# Patient Record
Sex: Male | Born: 1995 | Race: Black or African American | Hispanic: No | Marital: Single | Smoking: Never smoker
Health system: Southern US, Community
[De-identification: ages and names within clinical notes are randomized; demographics above are authoritative.]

---
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
## 2017-04-09 ENCOUNTER — Emergency Department
Admission: EM | Admit: 2017-04-09 | Discharge: 2017-04-09 | Disposition: A | Discharge status: Home / Self Care | Payer: Medicaid Other | Attending: Emergency Medicine | Admitting: Emergency Medicine

## 2017-04-09 ENCOUNTER — Encounter: Payer: Self-pay | Admitting: Emergency Medicine

## 2017-04-09 DIAGNOSIS — K600 Acute anal fissure: Secondary | ICD-10-CM | POA: Diagnosis not present

## 2017-04-09 DIAGNOSIS — K6289 Other specified diseases of anus and rectum: Secondary | ICD-10-CM | POA: Diagnosis present

## 2017-04-09 DIAGNOSIS — K602 Anal fissure, unspecified: Secondary | ICD-10-CM

## 2017-04-09 MED ORDER — NITROGLYCERIN 0.4 % RE OINT: 1.0000 "application " | TOPICAL_OINTMENT | Freq: Two times a day (BID) | RECTAL | 0 refills | Status: DC

## 2017-04-09 MED ORDER — LIDOCAINE VISCOUS 2 % MT SOLN: 20.0000 mL | OROMUCOSAL | 0 refills | Status: DC | PRN

## 2017-04-09 MED ORDER — LIDOCAINE 2 % EX GEL: 1.0000 "application " | Freq: Two times a day (BID) | CUTANEOUS | 0 refills | Status: DC

## 2017-04-09 MED ORDER — LIDOCAINE 2 % EX GEL: 1.0000 "application " | Freq: Two times a day (BID) | CUTANEOUS | 0 refills | Status: AC
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
## 2017-04-09 NOTE — Discharge Instructions (Signed)
Rectal nitroglycerin can make you dizzy, please apply while sitting and stay sitting or lying down for 30 minutes after application. Increase the fiber in your diet to have soft stools
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
## 2019-05-12 ENCOUNTER — Encounter: Payer: Self-pay | Admitting: Emergency Medicine

## 2019-05-12 ENCOUNTER — Emergency Department
Admission: EM | Admit: 2019-05-12 | Discharge: 2019-05-12 | Disposition: A | Discharge status: Home / Self Care | Payer: Self-pay | Attending: Emergency Medicine | Admitting: Emergency Medicine

## 2019-05-12 ENCOUNTER — Emergency Department: Payer: Self-pay

## 2019-05-12 ENCOUNTER — Other Ambulatory Visit: Payer: Self-pay

## 2019-05-12 DIAGNOSIS — M25571 Pain in right ankle and joints of right foot: Secondary | ICD-10-CM | POA: Insufficient documentation

## 2019-05-12 DIAGNOSIS — W1830XA Fall on same level, unspecified, initial encounter: Secondary | ICD-10-CM | POA: Insufficient documentation

## 2019-05-12 DIAGNOSIS — Y999 Unspecified external cause status: Secondary | ICD-10-CM | POA: Insufficient documentation

## 2019-05-12 DIAGNOSIS — Y939 Activity, unspecified: Secondary | ICD-10-CM | POA: Insufficient documentation

## 2019-05-12 DIAGNOSIS — Y929 Unspecified place or not applicable: Secondary | ICD-10-CM | POA: Insufficient documentation

## 2019-05-12 MED ORDER — NAPROXEN 500 MG PO TABS: 500.0000 mg | ORAL_TABLET | Freq: Two times a day (BID) | ORAL | Status: AC

## 2019-05-12 NOTE — ED Provider Notes (Signed)
[facility] Emergency Department Provider Note   ____________________________________________   First MD Initiated Contact with Patient 05/12/19 1222     (approximate)  I have reviewed the triage vital signs and the nursing notes.   HISTORY  Chief Complaint Ankle Pain    HPI Billy Howell is a 23 y.o. male patient complain of right ankle pain secondary to fall last night.  Patient the pain increased with weightbearing.  Patient in mild edema has noted no deformity.  Patient rates pain as a 5/10.  No palliative measure for complaint.  Patient described pain as "achy".         History reviewed. No pertinent past medical history.  There are no active problems to display for this patient.   History reviewed. No pertinent surgical history.  Prior to Admission medications   Medication Sig Start Date End Date Taking? Authorizing Provider  naproxen (NAPROSYN) 500 MG tablet Take 1 tablet (500 mg total) by mouth 2 (two) times daily with a meal. 05/12/19   Sable Feil, PA-C    Allergies Patient has no known allergies.  No family history on file.  Social History Social History   Tobacco Use  . Smoking status: Never Smoker  . Smokeless tobacco: Never Used  Substance Use Topics  . Alcohol use: No  . Drug use: Not on file    Review of Systems Constitutional: No fever/chills Eyes: No visual changes. ENT: No sore throat. Cardiovascular: Denies chest pain. Respiratory: Denies shortness of breath. Gastrointestinal: No abdominal pain.  No nausea, no vomiting.  No diarrhea.  No constipation. Genitourinary: Negative for dysuria. Musculoskeletal: Right ankle pain.   Skin: Negative for rash. Neurological: Negative for headaches, focal weakness or numbness.   ____________________________________________   PHYSICAL EXAM:  VITAL SIGNS: ED Triage Vitals  Enc Vitals Group     BP      Pulse      Resp      Temp      Temp src      SpO2      Weight      Height      Head Circumference      Peak Flow      Pain Score      Pain Loc      Pain Edu?      Excl. in [place]?     Constitutional: Alert and oriented. Well appearing and in no acute distress. Cardiovascular: Normal rate, regular rhythm. Grossly normal heart sounds.  Good peripheral circulation. Respiratory: Normal respiratory effort.  No retractions. Lungs CTAB. Musculoskeletal: No obvious deformity to the right ankle.  Patient full neck range of motion.  Patient has  moderate guarding palpation of medial aspect of the right ankle. Neurologic:  Normal speech and language. No gross focal neurologic deficits are appreciated. No gait instability. Skin:  Skin is warm, dry and intact. No rash noted. Psychiatric: Mood and affect are normal. Speech and behavior are normal.  ____________________________________________   LABS (all labs ordered are listed, but only abnormal results are displayed)  Labs Reviewed - No data to display ____________________________________________  EKG   ____________________________________________  RADIOLOGY  ED MD interpretation:    Official radiology report(s): Dg Ankle Complete Right  Result Date: 05/12/2019 CLINICAL DATA:  Status post fall last night with right ankle pain. EXAM: RIGHT ANKLE - COMPLETE 3+ VIEW COMPARISON:  None. FINDINGS: There is no evidence of fracture, dislocation, or joint effusion. There is no evidence of arthropathy  or other focal bone abnormality. Soft tissues are unremarkable. IMPRESSION: Negative. Electronically Signed   By: Sherian ReinWei-Chen  Lin M.D.   On: 05/12/2019 12:57    ____________________________________________   PROCEDURES  Procedure(s) performed (including Critical Care):  .Splint Application  Date/Time: 05/12/2019 1:20 PM Performed by: McLamb, Jerrel IvoryLinda A, RN Authorized by: Joni ReiningSmith,  K, PA-C       ____________________________________________   INITIAL IMPRESSION / ASSESSMENT AND PLAN / ED  COURSE  As part of my medical decision making, I reviewed the following data within the electronic MEDICAL RECORD NUMBER         Billy Delrae AlfredQuan D Howell was evaluated in Emergency Department on 05/12/2019 for the symptoms described in the history of present illness. He was evaluated in the context of the global COVID-19 pandemic, which necessitated consideration that the patient might be at risk for infection with the SARS-CoV-2 virus that causes COVID-19. Institutional protocols and algorithms that pertain to the evaluation of patients at risk for COVID-19 are in a state of rapid change based on information released by regulatory bodies including the CDC and federal and state organizations. These policies and algorithms were followed during the patient's care in the ED.      Patient presents with right ankle pain secondary to a fall.  Physical exam is grossly unremarkable except for moderate guarding.  Discussed neck x-ray findings with patient.  Patient placed in ankle splint given discharge care instruction.  Patient advised follow-up open-door clinic.   ____________________________________________   FINAL CLINICAL IMPRESSION(S) / ED DIAGNOSES  Final diagnoses:  Acute right ankle pain     ED Discharge Orders         Ordered    naproxen (NAPROSYN) 500 MG tablet  2 times daily with meals     05/12/19 1321           Note:  This document was prepared using Dragon voice recognition software and may include unintentional dictation errors.    Joni ReiningSmith,  K, PA-C 05/12/19 1324    Arnaldo NatalMalinda, Paul F, MD 05/12/19 1351

## 2019-05-12 NOTE — ED Triage Notes (Signed)
Presents with right ankle pain  S/p fall last pm

## 2019-06-11 ENCOUNTER — Other Ambulatory Visit: Payer: Self-pay

## 2019-06-11 ENCOUNTER — Emergency Department
Admission: EM | Admit: 2019-06-11 | Discharge: 2019-06-11 | Payer: HRSA Program | Attending: Emergency Medicine | Admitting: Emergency Medicine

## 2019-06-11 DIAGNOSIS — Z139 Encounter for screening, unspecified: Secondary | ICD-10-CM

## 2019-06-11 DIAGNOSIS — Z1159 Encounter for screening for other viral diseases: Secondary | ICD-10-CM | POA: Insufficient documentation

## 2019-06-11 DIAGNOSIS — Z20828 Contact with and (suspected) exposure to other viral communicable diseases: Secondary | ICD-10-CM | POA: Insufficient documentation

## 2019-06-11 LAB — SARS CORONAVIRUS 2 BY RT PCR (HOSPITAL ORDER, PERFORMED IN ~~LOC~~ HOSPITAL LAB): SARS Coronavirus 2: NEGATIVE
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
## 2019-06-11 NOTE — ED Notes (Signed)
Pt here for clearance for jail. Pt denies sx of covid but had temp 100.4 at jail. No temp here.Marland Kitchen
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
## 2021-07-24 ENCOUNTER — Other Ambulatory Visit: Payer: Self-pay

## 2021-07-24 ENCOUNTER — Emergency Department
Admission: EM | Admit: 2021-07-24 | Discharge: 2021-07-24 | Disposition: A | Discharge status: Home / Self Care | Payer: No Typology Code available for payment source | Attending: Emergency Medicine | Admitting: Emergency Medicine

## 2021-07-24 ENCOUNTER — Emergency Department: Payer: No Typology Code available for payment source

## 2021-07-24 DIAGNOSIS — R109 Unspecified abdominal pain: Secondary | ICD-10-CM | POA: Insufficient documentation

## 2021-07-24 DIAGNOSIS — T1490XA Injury, unspecified, initial encounter: Secondary | ICD-10-CM

## 2021-07-24 DIAGNOSIS — Y92410 Unspecified street and highway as the place of occurrence of the external cause: Secondary | ICD-10-CM | POA: Diagnosis not present

## 2021-07-24 DIAGNOSIS — M79622 Pain in left upper arm: Secondary | ICD-10-CM | POA: Insufficient documentation

## 2021-07-24 DIAGNOSIS — R4182 Altered mental status, unspecified: Secondary | ICD-10-CM | POA: Insufficient documentation

## 2021-07-24 LAB — CBC WITH DIFFERENTIAL/PLATELET
Abs Immature Granulocytes: 0.01 10*3/uL (ref 0.00–0.07)
Basophils Absolute: 0 10*3/uL (ref 0.0–0.1)
Basophils Relative: 0 %
Eosinophils Absolute: 0.1 10*3/uL (ref 0.0–0.5)
Eosinophils Relative: 1 %
HCT: 44.2 % (ref 39.0–52.0)
Hemoglobin: 15.5 g/dL (ref 13.0–17.0)
Immature Granulocytes: 0 %
Lymphocytes Relative: 39 %
Lymphs Abs: 2.3 10*3/uL (ref 0.7–4.0)
MCH: 34.7 pg — ABNORMAL HIGH (ref 26.0–34.0)
MCHC: 35.1 g/dL (ref 30.0–36.0)
MCV: 98.9 fL (ref 80.0–100.0)
Monocytes Absolute: 0.4 10*3/uL (ref 0.1–1.0)
Monocytes Relative: 7 %
Neutro Abs: 3.1 10*3/uL (ref 1.7–7.7)
Neutrophils Relative %: 53 %
Platelets: 205 10*3/uL (ref 150–400)
RBC: 4.47 MIL/uL (ref 4.22–5.81)
RDW: 12.2 % (ref 11.5–15.5)
WBC: 5.9 10*3/uL (ref 4.0–10.5)
nRBC: 0 % (ref 0.0–0.2)

## 2021-07-24 LAB — COMPREHENSIVE METABOLIC PANEL
ALT: 14 U/L (ref 0–44)
AST: 20 U/L (ref 15–41)
Albumin: 4.2 g/dL (ref 3.5–5.0)
Alkaline Phosphatase: 65 U/L (ref 38–126)
Anion gap: 10 (ref 5–15)
BUN: 8 mg/dL (ref 6–20)
CO2: 26 mmol/L (ref 22–32)
Calcium: 8.8 mg/dL — ABNORMAL LOW (ref 8.9–10.3)
Chloride: 104 mmol/L (ref 98–111)
Creatinine, Ser: 0.92 mg/dL (ref 0.61–1.24)
GFR, Estimated: >60 mL/min (ref >60)
Glucose, Bld: 82 mg/dL (ref 70–99)
Potassium: 3.4 mmol/L — ABNORMAL LOW (ref 3.5–5.1)
Sodium: 140 mmol/L (ref 135–145)
Total Bilirubin: 1.3 mg/dL — ABNORMAL HIGH (ref 0.3–1.2)
Total Protein: 7.3 g/dL (ref 6.5–8.1)

## 2021-07-24 MED ORDER — IOHEXOL 350 MG/ML SOLN
80.0000 mL | Freq: Once | INTRAVENOUS | Status: AC | PRN
  Administered 2021-07-24: 80 mL via INTRAVENOUS
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
## 2021-07-24 NOTE — Discharge Instructions (Addendum)
Please seek medical attention for any high fevers, chest pain, shortness of breath, change in behavior, persistent vomiting, bloody stool or any other new or concerning symptoms.  

## 2021-07-24 NOTE — ED Notes (Signed)
C-collar placed on patient in triage.
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
## 2024-01-03 ENCOUNTER — Ambulatory Visit
Admission: RE | Admit: 2024-01-03 | Discharge: 2024-01-03 | Disposition: A | Discharge status: Home / Self Care | Payer: Self-pay | Source: Ambulatory Visit

## 2024-01-03 VITALS — BP 106/72 | HR 84 | Temp 98.8°F | Resp 17

## 2024-01-03 DIAGNOSIS — K2900 Acute gastritis without bleeding: Secondary | ICD-10-CM

## 2024-01-03 DIAGNOSIS — E876 Hypokalemia: Secondary | ICD-10-CM

## 2024-01-03 DIAGNOSIS — E871 Hypo-osmolality and hyponatremia: Secondary | ICD-10-CM

## 2024-01-03 MED ORDER — ONDANSETRON HCL 4 MG PO TABS: 4.0000 mg | ORAL_TABLET | Freq: Three times a day (TID) | ORAL | 3 refills | Status: AC | PRN

## 2024-01-03 MED ORDER — OMEPRAZOLE 20 MG PO CPDR: 20.0000 mg | DELAYED_RELEASE_CAPSULE | Freq: Every day | ORAL | 3 refills | Status: AC
